# Patient Record
Sex: Female | Born: 1955 | Race: White | Hispanic: No | Marital: Married | Smoking: Never smoker
Health system: Western US, Academic
[De-identification: ages and names within clinical notes are randomized; demographics above are authoritative.]

## PROBLEM LIST (undated history)

## (undated) DIAGNOSIS — J040 Acute laryngitis: Secondary | ICD-10-CM

## (undated) HISTORY — DX: Acute laryngitis: J04.0

## (undated) HISTORY — PX: COLONOSCOPY: PRO007

## (undated) HISTORY — PX: PB EXCISION OF TONSIL TAGS: 42860

---

## 1985-07-04 DIAGNOSIS — J040 Acute laryngitis: Secondary | ICD-10-CM

## 1985-07-04 HISTORY — DX: Acute laryngitis: J04.0

## 2012-06-18 ENCOUNTER — Ambulatory Visit (INDEPENDENT_AMBULATORY_CARE_PROVIDER_SITE_OTHER): Payer: Self-pay | Admitting: Internal Medicine

## 2012-06-18 ENCOUNTER — Encounter (INDEPENDENT_AMBULATORY_CARE_PROVIDER_SITE_OTHER): Payer: Self-pay | Admitting: Internal Medicine

## 2012-06-18 VITALS — BP 135/74 | HR 88 | Temp 98.3°F | Ht 66.0 in | Wt 152.2 lb

## 2012-06-18 MED ORDER — OMEGA 3 PO: ORAL | Status: AC

## 2012-06-18 MED ORDER — MONTELUKAST SODIUM 10 MG OR TABS
10.0000 mg | ORAL_TABLET | Freq: Every evening | ORAL | Status: DC
Start: 2012-06-18 — End: 2016-02-08

## 2012-06-18 MED ORDER — BUDESONIDE (INHALATION) 180 MCG/ACT IN INHA
2.0000 | INHALATION_SPRAY | Freq: Every day | RESPIRATORY_TRACT | Status: DC
Start: 2012-06-18 — End: 2016-02-08

## 2012-06-18 MED ORDER — CAL-MAG ZINC II PO
ORAL | Status: DC
Start: ? — End: 2016-02-08

## 2012-06-18 MED ORDER — ASTRAGALUS PO
ORAL | Status: DC
Start: ? — End: 2016-02-08

## 2012-06-18 MED ORDER — VITAMIN D PO: 4000.00 [IU] | Freq: Every day | ORAL | Status: AC

## 2012-06-18 NOTE — Patient Instructions (Signed)
Please arrive 15 minutes early for your next appointment for check-in.*     Please bring all medications and supplements taken or a complete list to your next appointment.     Please bring current insurance cards and a photo ID to your next appointment.    Our phone number and fax number have changed  Phone [phone]  Fax [phone]

## 2012-06-18 NOTE — Progress Notes (Signed)
PULMONARY    CONSULTATION    Heidi Vaughn is a 56 year old woman who presents for another opinion regarding her chest symptoms.  She lives in [place], but is visiting her son here in [place].  She was born and raised in the [place].    She describes a significant episode of bronchitis in April, 2008.  She felt quite sick, and had a great deal of chest congestion, cough, and sputum production.  This took several months to clear up completely but she was left with residual symptoms of episodic cough, sputum production, and chest tightness. Infrequently, she coughs up small amounts of blood streaked sputum.  She does not have shortness of breath or exercise intolerance.  She walked from her son's apartment to our office today, a distance of about 1.5 miles.    Over the past 5 or 6 years she has seen numerous physicians including her regular internist, several pulmonologists, an ENT specialist, a gastroenterologist, and an infectious disease specialist.  Pulmonary function testing as demonstrated very mild airflow obstruction, but otherwise normal.  Chest CT done in 2009 was unremarkable.  Specifically there were no bronchial abnormalities noted.  Allergy testing showed only a significant response to dogs.  She does have two poodles at home.  She underwent bronchoscopy in 2009, and the results were unrevealing.  More recently, I an expectorated sputum specimen which demonstrated Aspergillus [place].  Because of this, she was put on a long course of voriconazole twice a day till January of next year.  She feels that the voriconazole has helped her breathing.    Heidi Vaughn has tried multiple medications at the suggestion of her pulmonologists.  She has used combination inhalers including Symbicort and Dulera with little effect on her symptoms of chest tightness and sputum production.  She has tolerated the voriconazole well and recent liver function  studies were normal.  In addition, she gets very little relief from albuterol.    She has had significant secondhand cigarette smoke as a child.  Her father smoked at least one package of cigarettes a day.  She does not describe any history of pneumonia in the past.  She did have very serious sinus problems at age 39.  She now uses a sinus wash on a regular basis with a Netti pot device.  A recent CT of her sinuses showed only minimal changes.    There was also concern for chronic GERD.  She did undergo upper endoscopy without significant findings.  Trials of acid suppression have not been helpful.  She did take a trial of prednisone in September and October of this year, perhaps with some benefit.  She has not had Pneumovax vaccination or influenza vaccination.  She was raised in [place], but there is no evidence of tuberculosis exposure.    Current Outpatient Prescriptions   Medication Sig Dispense Refill   . ASTRAGALUS PO        . budesonide (PULMICORT) 180 MCG/ACT inhaler Inhale 2 puffs by mouth daily. Rinse mouth well after use.  1 Inhaler  1   . Cholecalciferol (VITAMIN D PO) 4,000 Units by Mouth/Throat route daily.       . montelukast (SINGULAIR) 10 MG tablet Take 1 tablet by mouth every evening.  30 tablet  3   . Multiple Minerals-Vitamins (CAL-MAG ZINC II PO)        . Omega-3 Fatty Acids (OMEGA 3 PO)          No current facility-administered medications for  this visit.     PHYSICAL  EXAMINATION    BP 135/74  Pulse 88  Temp(Src) 98.3 F (36.8 C) (Oral)  Ht 5\' 6"  (1.676 m)  Wt 69.037 kg (152 lb 3.2 oz)  BMI 24.58 kg/m2  SpO2 97%    Gen.: She appears well today.  She is not dyspneic.  Not coughing.  Skin: No rash  HEENT: TMs normal.  Nasal exam is unremarkable.  Oropharynx is normal.  No evidence of chronic postnasal drip  Neck: No mass or adenopathy.  Thyroid with mild nodularity.  No JVD.  Chest: Clear.  No wheezing, no rales.  Heart: Regular rhythm at 80.  No significant murmur.  No  gallop.  Extremities: No edema.      ASSESSMENT.  #1.  Chronic asthmatic bronchitis, with more emphasis on the bronchitis aspect of her problem.  #2.  The finding of Aspergillus in an expectorated sputum sample is of no consequence.  There is no evidence of infection or invasive disease.  She does not have any of the Aspergillus associated pulmonary symptoms.  #3.  Chronic sinus disease as well as chronic acid reflux have been evaluated.  There is no evidence that they contribute significantly to her problem.      PLANS.  #1.  Patient education, extensive.  #2.  Trial of inhaled corticosteroid: Pulmicort 160, two puffs each morning.  Rinse mouth well after use.  #3.  Singulair 10 mg once daily.  #4.  Good fluid intake.  Continue regular sinus washes.  Avoid cigarette smokers and aerosolized chemicals.  #5. I strongly recommended Pneumovax vaccination and influenza vaccination.  She will consider this.  #6.  She will be returning to [place] later this month.      Gypsy Lore MD FACP [facility]  Professor of Clinical Medicine

## 2016-01-28 ENCOUNTER — Telehealth (INDEPENDENT_AMBULATORY_CARE_PROVIDER_SITE_OTHER): Payer: Self-pay | Admitting: Allergy & Immunology

## 2016-01-28 NOTE — Telephone Encounter (Signed)
Patient is scheduled for a consult with: Dr. Riedl    Internal or External Referral: External    Was Auth Entered: Yes

## 2016-02-01 NOTE — Telephone Encounter (Signed)
New patient information sent via e-mail    Thank you.

## 2016-02-08 ENCOUNTER — Ambulatory Visit (INDEPENDENT_AMBULATORY_CARE_PROVIDER_SITE_OTHER): Admitting: Neurology

## 2016-02-08 VITALS — BP 114/62 | HR 67 | Ht 66.0 in | Wt 146.0 lb

## 2016-02-08 DIAGNOSIS — H21569 Pupillary abnormality, unspecified eye: Secondary | ICD-10-CM

## 2016-02-08 DIAGNOSIS — R51 Headache: Principal | ICD-10-CM

## 2016-02-08 MED ORDER — ZOLPIDEM TARTRATE 5 MG OR TABS: 5.00 mg | ORAL_TABLET | Freq: Every evening | ORAL | Status: AC | PRN

## 2016-02-08 MED ORDER — MAGNESIUM CITRATE 100 MG PO TABS: ORAL_TABLET | ORAL | Status: AC

## 2016-02-08 MED ORDER — FEXOFENADINE HCL 180 MG OR TABS: 180.00 mg | ORAL_TABLET | Freq: Every day | ORAL | Status: AC

## 2016-02-08 MED ORDER — ZINC 50 MG OR CAPS: 50.00 mg | ORAL_CAPSULE | ORAL | Status: AC

## 2016-02-08 MED ORDER — SELENIUM 200 MCG PO TABS: 200.00 mg | ORAL_TABLET | ORAL | Status: AC

## 2016-02-08 MED ORDER — LEVOTHYROXINE SODIUM 25 MCG OR TABS: 25.00 ug | ORAL_TABLET | Freq: Every day | ORAL | Status: AC

## 2016-02-08 NOTE — Interdisciplinary (Signed)
I, Arneta Cliche, have reviewed medications and allergies with patient.

## 2016-02-08 NOTE — Progress Notes (Addendum)
Subjective:   Heidi Vaughn is a 60 year old female who is here for Headache    Lives in [facility] [place],  Had some new symptoms and additiional MRI.  HAs are more constant.   Shooting pains over the L side of he head.  Prior w/u in 6/15.  Admits to a lot of stress.   One pupil of eye was "much larger" saw an ophthalmologist, but a repeat MRI showed nothing; now pupils are reasonable equal.  Still with clogged" ears and hearing issues. Pain in throat as well.  An MRI of the cervical spine from 1/15 showed multilevel, diffuse cervical spondylosis with bilateral NF narrowing and question of compression of the bilateral C6 and R C7 nerve roots.  MRI of the orbits and face showed no significant abnormalities to explain her history of L eye pain.  CT of soft tissue of the neck was negative for lymphadenopathy.  MRI brain from 12/13/13 was normal.  Seeingh Dr. Ellsworth Lennox again this week for a rheumatologic reevaluation.  She was trying to take antidepressants in the past, without perceived benefit.  No current sinus or dental infections or trauma recently, though some childhood trauma  She brings in some ENT notes from [place] for review today. One study showed some thickening of the left pyriform sinus.  Question of trigeminal neuralgia or glossopharyngeal neuralgia. Angioedema  ?may have tried Lyrica.   All symptoms are left sided.   One imaging study showed a "mass"      HPI    Issues discussed today:  Interim history.    Review of Systems   No other new symptoms to discuss.    Current Outpatient Prescriptions   Medication Sig Dispense Refill   . Cholecalciferol (VITAMIN D PO) 4,000 Units by Mouth/Throat route daily.     . fexofenadine (ALLEGRA) 180 MG tablet Take 180 mg by mouth daily.     Marland Kitchen levothyroxine (SYNTHROID) 25 MCG tablet Take 25 mcg by mouth every morning (before breakfast).     . Magnesium Citrate 100 MG TABS      . Omega-3 Fatty Acids (OMEGA 3 PO)      . Selenium 200 MCG TABS 200 mg.     . Zinc 50 MG CAPS  50 mg.     . zolpidem (AMBIEN) 5 MG tablet Take 5 mg by mouth nightly as needed for Insomnia.       No current facility-administered medications for this visit.      No Known Allergies    Reviewed patients pertinent information related to social history, past medical, past surgical, and family history.     Objective:  Vital signs: BP 114/62 (BP cuff site: Left;Upper;Arm, BP Patient Position: Sitting, BP cuff size: Regular)  Pulse 67  Ht 5\' 6"  (1.676 m)  Wt 66.2 kg (146 lb)  BMI 23.57 kg/m2  Heart S1S2 normal. No bruits.    Neurologic Exam  Pupils now mid position and equal.  General: No distress  Ext: No LE edema  Skin: No rashes or lesions  Neurological Examination  Mental Status: Awake, alert, able to provide their own history  Speech: fluent without dysarthria  CNs: PERRL (I don't really notice any anisocoria at this time), EOMI, no facial weakness, hearing intact to conversation  Strength: Full in proximal and distal upper and lower extremities  Coordination: No ataxia on finer to nose  Gait: Normal arm swing and stride length    Assessment/Plan:  [name] was seen today for  Headache  HAs, not really typical of migraine or cluster.  Complex of pain related and otologic symptoms, mainly L sided - etiology uncertain, with essentially negative ENT w/u.  ?pupillary anisocoria by hx - says she has been referred to neuro-ophthalmology.  Talked about our [facility] here as well.    Plan: Referral to Dr. Rollene Fare for [facility] evaluation of the atypical HAs and facial pain.    Diagnosed with:    ICD-10-CM ICD-9-CM    1. Headache, unspecified headache type R51 784.0 CONSULT/REFERRAL TO NEUROLOGY   2. Pupillary abnormalities, unspecified laterality H21.569 364.75      Orders Placed This Encounter   Procedures   . Consult to Neurology Clinic     Total time spent with patient was 30 minutes of which more than  50 % was spent educating the patient about their condition and discussing diagnosis and management issues        Electronically signed by: Javier Docker, MD   Signature Derived From Controlled Access Password, February 12, 2016, 9:21 PM

## 2016-02-08 NOTE — Patient Instructions (Signed)
Referral to [facility].

## 2016-02-10 ENCOUNTER — Ambulatory Visit (INDEPENDENT_AMBULATORY_CARE_PROVIDER_SITE_OTHER): Admitting: Ophthalmology

## 2016-02-10 DIAGNOSIS — R51 Headache: Secondary | ICD-10-CM

## 2016-02-16 ENCOUNTER — Other Ambulatory Visit: Attending: Allergy & Immunology | Admitting: Allergy & Immunology

## 2016-02-16 ENCOUNTER — Encounter (INDEPENDENT_AMBULATORY_CARE_PROVIDER_SITE_OTHER): Payer: Self-pay | Admitting: Allergy & Immunology

## 2016-02-16 VITALS — BP 131/79 | HR 68 | Temp 97.7°F | Resp 16 | Ht 66.0 in | Wt 146.0 lb

## 2016-02-16 DIAGNOSIS — T783XXA Angioneurotic edema, initial encounter: Principal | ICD-10-CM | POA: Insufficient documentation

## 2016-02-16 DIAGNOSIS — D8940 Mast cell activation, unspecified: Secondary | ICD-10-CM

## 2016-02-16 MED ORDER — FAMOTIDINE 40 MG OR TABS
40.00 mg | ORAL_TABLET | ORAL | Status: AC
Start: 2016-02-10 — End: 2017-02-09

## 2016-02-16 MED ORDER — CROMOLYN SODIUM 100 MG/5ML OR CONC
ORAL | Status: AC
Start: 2016-02-10 — End: ?

## 2016-02-16 MED ORDER — LOTEMAX OP: OPHTHALMIC | Status: AC

## 2016-02-16 NOTE — Progress Notes (Signed)
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 Albin FellingProshkina.     Sx started ~07/2013 consisting of diffuse erythematous patches w/ pruritis; sx resolved with steroids and has not returned. Patient has had chronic intermittent hoarse voice and throat tightness w/o respiratory compromise; these sx typically last from several hours to half a day, improved with anti-histamines and steroids. Typically patient experiences up to several attacks per day. Denies swelling of her limbs/digits or lip swelling; patient intermittently has swelling of her lips. Denies abdominal sx including n/v, bloating, abd pain, and diarrhea. No hx of ACE use. Additionally patient c/o pruritis of eyes and ears as well as head and sinus fullness.         PMH  Past Medical History:   Diagnosis Date   . Laryngitis 1987       No Known Allergies    ROS  Complete 12 point ROS negative as below:  HEENT: as noted above in HPI  GEN-denies fevers/chills/rigors  CV-denies Chest pain, SOB  PULM-denies cough, sputum production wheeze  ABD-denies n/v, abd pain, diarrhea  SKIN-denies skin rashes currently        PE   02/16/16  0906   BP: 131/79   Pulse: 68   Resp: 16   Temp: 97.7 F (36.5 C)   SpO2: 99%     GEN: in NAD   HEENT:   -sclera: no injection   -Turbinates: erythema b/l w/ moderate mucous burden   -TM: clear b/l   -oropharynx: no erythema  PULM: CTAB, no wheeze or crackles  CV: RRR, no m/r/g  SKIN: no rashes or lesions      LABS: reviewed  TPO 78.6  Hep B Surf Ag neg  Hep C Neg  C1 111; C1Q6  IgM 410      IMAGING: reviewed  MRI Brain maxillo-facial 12/2015 notable for R inferior maxillary mucous retention cyst.        A/P: 60y/o hx of asthmatic bronchitis presenting w/ sx concerning for angioedema.    #Angioedema, ?Mastocytosis-dx of angioedema consistent with patient's subjective sx of throat tightness/hoarseness and sinus fullness,  minimal GI and cutaneous manifestations. The fact that patient's symptoms responded well to steroids and anti-histamines indicates likely histaminergic etiology though the dosage of patient's current anti-histamine regimen has not been enough to control sx completely. 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 Pt to make up follow up appointment w/ her heme/onc team.      This patient seen and evaluated with Dr. Starr Sinclaireidl    RTC 1 month or later    Heidi HectorJohn Kachina Vaughn, PGY3  1622      MEDS  Levothyroxine 25  Hydroxyzine PRN  Allegra 180 qd  Famotidine 40mg   Cromolyn 100-25300mcg

## 2016-02-16 NOTE — Patient Instructions (Signed)
1) Have recommended labs completed to identify or exclude underlying causes of throat/swelling symptoms  2) Start daily regimen of zyrtec (cetirizine) 10mg  - take 1 tab in am and 1 tab in pm.  This can be increased to 2 tabs in am and 2 tabs in pm if symptoms persist.  3) Follow-up with me in ~1 month to review lab results and review effect of zyrtec treatment.   We will then determine the next steps.  4) Use MyChart or call if questions arise in the interim.

## 2016-02-16 NOTE — Interdisciplinary (Signed)
Patient identity authenticated by Draylen Lobue, RN.  Right AC selected for lab draw. Area prepped with alcohol. 21 gauge needle used to collect labs. Gauze placed and arm wrapped with coban. Pt tolerated lab draw well.

## 2016-02-17 LAB — ALLERGENS, INHALANT PROFILE
Allergen, Bermuda Grass IgE: <0.1 kU/L (ref <0.10)
Allergen, Cat IgE: <0.1 kU/L (ref <0.10)
Allergen, Common /Short Weed IgE: <0.1 kU/L (ref <0.10)
Allergen, Cottonwood Tree IgE: <0.1 kU/L (ref <0.10)
Allergen, Dog IgE: <0.1 kU/L (ref <0.10)
Allergen, Elm Tree IgE: <0.1 kU/L (ref <0.10)
Allergen, English Plantain Weed IgE: <0.1 kU/L (ref <0.10)
Allergen, Fungi/Mold Alternaria alternata IgE: <0.1 kU/L (ref <0.10)
Allergen, Fungi/Mold Aspergillus fumigatus IgE: <0.1 kU/L (ref <0.10)
Allergen, Fungi/Mold Cladosporum herbarum IgE: <0.1 kU/L (ref <0.10)
Allergen, Fungi/Mold Penicillium chrysogenum IgE: <0.1 kU/L (ref <0.10)
Allergen, Grey Alder Tree IgE: <0.1 kU/L (ref <0.10)
Allergen, Insect Cockroach German IgE: <0.1 kU/L (ref <0.10)
Allergen, Johnson Grass IgE: <0.1 kU/L (ref <0.10)
Allergen, June/Kentucky Grass IgE: <0.1 kU/L (ref <0.10)
Allergen, Lamb Quarters Weed IgE: <0.1 kU/L (ref <0.10)
Allergen, Mesquite Tree IgE: <0.1 kU/L (ref <0.10)
Allergen, Mites, Dermatophagoides farinae IgE: <0.1 kU/L (ref <0.10)
Allergen, Mites, Dermatophagoides pteronyssinus IgE: <0.1 kU/L (ref <0.10)
Allergen, Mugwort Weed IgE: <0.1 kU/L (ref <0.10)
Allergen, Oak Tree IgE: <0.1 kU/L (ref <0.10)
Allergen, Olive Tree IgE: <0.1 kU/L (ref <0.10)
Allergen, Perennial Rye Grass IgE: <0.1 kU/L (ref <0.10)
Allergen, Russian Thistle Weed IgE: <0.1 kU/L (ref <0.10)
Allergen, Timothy Grass IgE: <0.1 kU/L (ref <0.10)
Allergen, Walnut Tree IgE: <0.1 kU/L (ref <0.10)
IGE: 9 kU/L (ref <=214)

## 2016-02-18 ENCOUNTER — Encounter (INDEPENDENT_AMBULATORY_CARE_PROVIDER_SITE_OTHER): Payer: Self-pay | Admitting: Allergy & Immunology

## 2016-02-18 NOTE — Progress Notes (Signed)
I performed a history and physical exam of the patient and discussed the patient's management with the resident.  I reviewed the resident's note and agree with the documented findings and the plan of care we developed.  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  I reviewed her symptoms in detail and discussed with her the recommended diagnostic tests.  She has a history of thyroiditis and MGUS and I strongly recommended she continue to f/u with endocrine and heme/onc respectively to ensure these are adequately managed as these condition can sometimes influence the course of urticaria and angioedema.  We discussed a management plan to institute to determine the effect of histamine-targeted therapy while we await the lab results.  I answered her questions and she was comfortable with this plan.     1) Have recommended labs completed to identify or exclude underlying causes of throat/swelling symptoms  2) Start daily regimen of zyrtec (cetirizine) 10mg  - take 1 tab in am and 1 tab in pm.  This can be increased to 2 tabs in am and 2 tabs in pm if symptoms persist.  3) Patient to f/u with her heme/onc specialist regarding MGUS managment  3) patient to follow-up with me in ~1 month to review lab results and review effect of zyrtec treatment.   We will then determine the next steps.  4) patient to use MyChart or call if questions arise in the interim.    Total time in face-to-face consultation this visit was 90 minutes, more that 50% of which was spent in counseling.

## 2016-02-19 LAB — URTICARIA-INDUCED BASOPHIL ACTIVATION, BLOOD: Urticaria-INDUCED BASOPHIL ACTIVATION-ARUP: 8 % (ref <=27)

## 2016-02-19 LAB — B-CELL CLONALITY (IGH AND IGK), BLOOD
IgH Result: NOT DETECTED
IgK Result: NOT DETECTED
Result: NOT DETECTED

## 2016-02-20 LAB — TRYPTASE, BLOOD: Tryptase: 10.5 ug/L (ref <=10.9)

## 2016-02-24 LAB — IMMUNOFIX INTERPRETATION, SERUM

## 2016-02-24 LAB — IMMUNOFIXATION, BLOOD

## 2016-02-25 LAB — HELICOBACTER PYLORI ANTIBODY-ELISA, BLOOD: Helicobacter Pylori Ab: NEGATIVE

## 2016-03-03 ENCOUNTER — Ambulatory Visit (INDEPENDENT_AMBULATORY_CARE_PROVIDER_SITE_OTHER): Admitting: Otolaryngology

## 2016-03-03 ENCOUNTER — Ambulatory Visit (INDEPENDENT_AMBULATORY_CARE_PROVIDER_SITE_OTHER): Admitting: Audiology

## 2016-03-04 ENCOUNTER — Encounter (INDEPENDENT_AMBULATORY_CARE_PROVIDER_SITE_OTHER): Payer: Self-pay

## 2016-03-04 ENCOUNTER — Ambulatory Visit (INDEPENDENT_AMBULATORY_CARE_PROVIDER_SITE_OTHER): Admitting: Neurology

## 2016-03-07 ENCOUNTER — Encounter (INDEPENDENT_AMBULATORY_CARE_PROVIDER_SITE_OTHER): Payer: Self-pay | Admitting: Allergy & Immunology

## 2016-03-08 LAB — CHROMOGENIC C1-INH FUNCTION

## 2016-03-08 NOTE — Telephone Encounter (Signed)
From: Dianah FieldIrene Carnero  To: Elie Goodyiedl, Marc A, MD  Sent: 03/07/2016 10:14 AM PDT  Subject: 20-Other    Dr. Tawnya Crookiedl, I have visited you on 02-16-16. [place] [address] [place] I'm going to be back home on Wednesday 03-09-16.   Can I make a tel or Skype follow up appointment with you to discuss my symptoms and the results of my tests?

## 2016-03-14 ENCOUNTER — Telehealth (INDEPENDENT_AMBULATORY_CARE_PROVIDER_SITE_OTHER): Payer: Self-pay

## 2016-03-14 NOTE — Telephone Encounter (Signed)
Spoke with Dr. Tawnya Crookiedl and he could not find a [facility]. I spoke with Carley HammedEva at [facility] and advised that [facility] ships this lab to be processed at [facility]. Requested if [facility] could do the same. Awaiting a call back from Carley Hammedva once she speaks to her supervisor.

## 2016-03-14 NOTE — Telephone Encounter (Signed)
[facility] in [place], [place] calling because unable to process Leukotriene E4 24 hr Urine Test. Requiring another name for test and test number specific to [facility]. Awaiting Dr. Fuller Canadaiedl's advisement when he is in clinic at noon.

## 2016-03-15 NOTE — Telephone Encounter (Signed)
Spoke with Heidi Vaughn. Unable to process request. LTE 4 will not be processed at this time.

## 2016-03-20 ENCOUNTER — Encounter (INDEPENDENT_AMBULATORY_CARE_PROVIDER_SITE_OTHER): Payer: Self-pay | Admitting: Allergy & Immunology

## 2016-03-21 ENCOUNTER — Encounter (INDEPENDENT_AMBULATORY_CARE_PROVIDER_SITE_OTHER): Payer: Self-pay

## 2016-03-22 NOTE — Telephone Encounter (Signed)
Forwarding to MD for review

## 2016-03-25 ENCOUNTER — Encounter (INDEPENDENT_AMBULATORY_CARE_PROVIDER_SITE_OTHER): Payer: Self-pay

## 2016-04-02 ENCOUNTER — Telehealth (INDEPENDENT_AMBULATORY_CARE_PROVIDER_SITE_OTHER): Payer: Self-pay | Admitting: Allergy & Immunology

## 2016-04-02 NOTE — Telephone Encounter (Signed)
I spoke to patient by phone for f/u.  She reports her cutaneous symptoms are much improved with taking cetirizine.  She initially took this 20mg  BID as advised and had complete resolution of symptoms.  She gradually tapered her dose over several weeks and eventually began to have recurrence of her skin redness, itching, and swelling which was acutely improved with taking more cetirizine.  She currently is taking cetirizine as needed and is overall satisfied with the symptom relief it provides.  She remains concerned that a local company tested her home and found some airborne mold which she thinks may be causing her symptoms.  I reviewed with her the labs results we performed which do not show any evidence of a systemic mast cell condition. Her allergy testing did not show any evidence of mold allergy.  I advised her that more comprehensive allergy testing or skin testing could still show some sensitivities we did not detect, but I cautioned her against worrying about "toxic mold" issues as this is largely a scare tactic used by remediation companies to encourage unnecessary construction work.  If she is concerned about mold allergy or irritant effects, I advised that she pursue additional allergy testing locally or consider a trial of a HEPA filter in the room(s) of concern as this will effectively remove airborne mold spores.  In the meantime, I recommended she continue with her antihistamine regimen as needed as this has been highly effective in treating her symptoms.  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  She plans to visit our clinic again in Feb 2018, so we'll plan to retest her then.  She'll contact me if anything changes with her clinical status in the meantime.

## 2016-04-11 ENCOUNTER — Encounter (INDEPENDENT_AMBULATORY_CARE_PROVIDER_SITE_OTHER): Payer: Self-pay

## 2016-08-18 ENCOUNTER — Telehealth (INDEPENDENT_AMBULATORY_CARE_PROVIDER_SITE_OTHER): Payer: Self-pay | Admitting: Allergy & Immunology

## 2016-08-18 NOTE — Telephone Encounter (Signed)
Attempted to contact patient per epic message regarding follow up appointment. No answer left voicemail for patient to call back and schedule. Please assist Thank You

## 2016-10-03 ENCOUNTER — Encounter (INDEPENDENT_AMBULATORY_CARE_PROVIDER_SITE_OTHER): Payer: Self-pay | Admitting: Allergy & Immunology

## 2017-02-17 ENCOUNTER — Telehealth (INDEPENDENT_AMBULATORY_CARE_PROVIDER_SITE_OTHER): Payer: Self-pay | Admitting: Allergy & Immunology

## 2017-02-17 NOTE — Telephone Encounter (Signed)
-----   Message from Latanya Presser, [place] sent at 02/17/2017 10:46 AM PDT -----  Regarding: FW: Appointment Request  Contact: [phone]      ----- Message -----     From: Heidi Vaughn     Sent: 02/17/2017   8:02 AM       To: [facility]  Subject: Appointment Request                              Appointment Request From: Heidi Vaughn    With Provider: Elie Goody, MD [facility]]    Preferred Date Range: From 02/17/2017 To 03/13/2017    Preferred Times: Any    Reason for visit: Ear and throat pain, possibly because of some allergy     Comments:  Please, make an appointment for me before 03/14/2017. [place]'m [place], and leaving on 03/14/2017  Thank you.

## 2017-02-17 NOTE — Telephone Encounter (Signed)
Called patient, per Dr. Tawnya Crook scheduled follow up appt for 02/21/17 at 3:45pm. Patient agreed.

## 2017-02-21 ENCOUNTER — Other Ambulatory Visit (INDEPENDENT_AMBULATORY_CARE_PROVIDER_SITE_OTHER): Attending: Allergy & Immunology

## 2017-02-21 ENCOUNTER — Ambulatory Visit (INDEPENDENT_AMBULATORY_CARE_PROVIDER_SITE_OTHER): Admitting: Allergy & Immunology

## 2017-02-21 ENCOUNTER — Encounter (INDEPENDENT_AMBULATORY_CARE_PROVIDER_SITE_OTHER): Payer: Self-pay | Admitting: Allergy & Immunology

## 2017-02-21 VITALS — BP 110/71 | HR 70

## 2017-02-21 DIAGNOSIS — T783XXD Angioneurotic edema, subsequent encounter: Principal | ICD-10-CM

## 2017-02-21 DIAGNOSIS — J3089 Other allergic rhinitis: Secondary | ICD-10-CM

## 2017-02-21 MED ORDER — CYCLOSPORINE 0.05 % OP EMUL: 1.00 [drp] | Freq: Two times a day (BID) | OPHTHALMIC | Status: AC

## 2017-02-21 MED ORDER — CETIRIZINE HCL 10 MG OR TABS: 10.00 mg | ORAL_TABLET | Freq: Every day | ORAL | Status: AC

## 2017-02-21 NOTE — Interdisciplinary (Signed)
Blood drawn from left arm with 23 gauge needle. 1 tubes taken.   Patient identity authenticated by Laurence J Orgaya.

## 2017-02-23 LAB — TRYPTASE, BLOOD: Tryptase: 11.1 ug/L — ABNORMAL HIGH (ref <=10.9)

## 2017-02-26 ENCOUNTER — Encounter (INDEPENDENT_AMBULATORY_CARE_PROVIDER_SITE_OTHER): Payer: Self-pay | Admitting: Allergy & Immunology

## 2017-02-26 NOTE — Progress Notes (Signed)
Patient is a 61 yo female presenting for consultation, evaluation, and management of allergic respiratory and skin symptoms.    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 She reports struggling lately from a clinical standpoint due to upper airway symptoms. She describes these as a recurring left-sided throat pain which occasionally radiates to her left ear. Her ears often feel clogged and she also describes her ears occasionally turning red with mild swelling and burning. She showed me photographs of her right ear which was clearly erythematous and slightly swollen. Overall she feels very anxious about the symptoms and is bothered by the discomfort in her throat, her postnasal drip and ear discomfort. Because of this she saw ENT which did not see any evidence of throat inflammation. She was treated for reflux with a proton pump inhibitor for 2-3 months but did not experience substantial improvement. 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 She has previously been on Zyrtec or fexofenadine daily and intermittently though has not taken these on a regular basis. She has not recently used intranasal steroids. She also describes ocular pruritus symptoms though these are less bothersome to her than the throat symptoms.    Current Meds  Current Outpatient Prescriptions:     cetirizine (ZYRTEC) 10 MG tablet, Take 10 mg by mouth daily., Disp: , Rfl:     Cholecalciferol (VITAMIN D PO), 4,000 Units by Mouth/Throat route daily., Disp: , Rfl:     cromolyn (GASTROCROM) 100 MG/5ML solution, 5-10 ml po before meals and at bedtime, Disp: , Rfl:     cycloSPORINE (RESTASIS) 0.05 % ophthalmic emulsion, Place 1 drop into both eyes 2 times daily., Disp: , Rfl:     famotidine (PEPCID) 40 MG tablet, Take 40 mg by mouth., Disp: , Rfl:     fexofenadine (ALLEGRA) 180 MG tablet, Take 180 mg by mouth daily., Disp:  , Rfl:     levothyroxine (SYNTHROID) 25 MCG tablet, Take 25 mcg by mouth every morning (before breakfast)., Disp: , Rfl:     Loteprednol Etabonate (LOTEMAX OP), , Disp: , Rfl:     Magnesium Citrate 100 MG TABS, , Disp: , Rfl:     Omega-3 Fatty Acids (OMEGA 3 PO), , Disp: , Rfl:     Selenium 200 MCG TABS, 200 mg., Disp: , Rfl:     Zinc 50 MG CAPS, 50 mg., Disp: , Rfl:     zolpidem (AMBIEN) 5 MG tablet, Take 5 mg by mouth nightly as needed for Insomnia., Disp: , Rfl:   Allergies:Review of patient's allergies indicates no known allergies.    14-Point Review Of Systems: Constitutional: No fevers, chills, or weight loss. Eyes: no diplopia, no photophobia. ENT: No tinnitus or vertigo. Cardiovascular: no chest pain, palpitations, or swelling of lower extremities. Respiratory: no hemoptysis or cyanosis. Gastrointestinal: No nausea, vomiting, or diarrhea. Genitourinary: No hematuria or dysuria. Musculoskeletal: No muscle weakness, muscle pain, or joint swelling. Skin: no wounds, incisions, or nodules Neurologic:no seizures, limb weakness, or speech problems   Psychiatric:no chronic depression, anxiety, or anhedonia.  Endocrine: no heat or cold intolerance, no goiter. Hematologic: no bleeding, no lympadenopathy. Allergic: no anaphylaxis, sting or food reactions except as above. All other systems negative except as described in the HPI.    Physical Examination:Blood pressure 110/71, pulse 70, SpO2 99 %.   GENERAL APPEARANCE: well-developed, no acute distress. EYES: normal conjunctivae and lids bilaterally. Bilateral PERRLA,  EOMI. ENT: oropharynx clear, no thrush or oral ulcers. Ear canals and TMs clear bilaterally. Nasal mucosa clear bilaterally, no polyps or septal deviation. NECK: supple with normal range of motion, no masses. No thyroid masses or tenderness. RESPIRATORY: Normal respiratory effort, chest clear to auscultation and percussion bilaterally, no rales or wheezes, normal I:E ratio. CARDIOVASCULAR: RRR, S1S2,  no m/g/r. 2+ radial and pedal pulses bilaterally. No lower extremity edema. ABDOMEN: soft, NT/ND, no masses, no hepatosplenomegaly. LYMPHATIC: No cervical or axillary LAD. MUSCULOSKELETAL: No extremity clubbing or cyanosis. Normal range of motion in all four extremities. Normal muscle strength and tone in all four extremities. SKIN: Warm with normal turgor. No rashes or lesions.    Impression/ Recommendations:  1) Heidi Vaughn is a 61 year old woman with a history of recurrent throat discomfort, postnasal drip, ear pain and swelling. I had a long discussion with her today about the possible allergic or inflammatory causes of this. Her previous serological IgE testing was negative, but given the bothersome nature of her symptoms I recommended we pursue allergy skin tests to identify any environmental triggers for her symptoms with better sensitivty. We also discussed the possibility of a sinus CT though we will hold off on this for the moment. I recommended we check a tryptase level to evaluate for mast cell issues given that some of her symptoms are consistent with this possibility and her previous tryptase level was at the upper limit of normal.  I recommended she resume taking Zyrtec 10 mg daily on a regular basis to see if this will improve any of her symptoms. Once we have her test results we can better direct therapy based on the findings. I answered her questions and she was comfortable proceeding in this fashion. I will see her back in a few weeks after her allergy skin testing so we can review a more detailed management approach.    Total time in face-to-face consultation this visit was 40 minutes, more that 50% of which was spent in counseling the patient on symptoms and causes of the medical condition, interpretation of the diagnostic test results, as well as management options and recommendations.

## 2017-03-02 ENCOUNTER — Encounter (INDEPENDENT_AMBULATORY_CARE_PROVIDER_SITE_OTHER): Payer: Self-pay | Admitting: Allergy & Immunology

## 2017-03-02 DIAGNOSIS — L209 Atopic dermatitis, unspecified: Principal | ICD-10-CM

## 2017-03-02 MED ORDER — DESOXIMETASONE 0.05 % EX CREA
TOPICAL_CREAM | CUTANEOUS | 2 refills | Status: AC
Start: 2017-03-02 — End: ?

## 2017-03-02 NOTE — Telephone Encounter (Signed)
From: Dianah FieldIrene Zolman  To: Elie Goodyiedl, Marc A, MD  Sent: 03/02/2017 6:34 AM PDT  Subject: 20-Other    My blood was drawn on 08/21 at emergency center. When will it be posted on the portal?

## 2017-03-02 NOTE — Telephone Encounter (Signed)
From: [name]  To: Elie Goodyiedl, Marc A, MD  Sent: 03/02/2017 6:32 AM PDT  Subject: 20-Other    Please, I need a prescription for the following medication: Topicort (Desoxlmetasone Cream usp, 0.05%)

## 2017-03-07 ENCOUNTER — Telehealth (INDEPENDENT_AMBULATORY_CARE_PROVIDER_SITE_OTHER): Payer: Self-pay | Admitting: Allergy & Immunology

## 2017-03-07 NOTE — Telephone Encounter (Signed)
Patient is requesting a call back with results:    Order Provider: Dr.Riedl  Test:Tryptase, Blood Yellow serum separator tube (Order [id number])  Date of Test:done 8/21  Best Call Back #  OK to leave a Voicemail (Y/N)?    Pt requesting for clinic to place results on Mychart    Pt requesting call back from clinic to reschedule appt for Allergy test, pt requesting earlier appt, if not, pt will be ok to come in in the afternoon.  Please assist.  thank you.

## 2017-03-07 NOTE — Telephone Encounter (Signed)
Pt notified there are zero appointments on Friday 03/10/17 in the a[facility].  Pt opting to keep afternoon appt and will check in at 1500.  No further request at this time; saving this to chart.

## 2017-03-10 ENCOUNTER — Ambulatory Visit (INDEPENDENT_AMBULATORY_CARE_PROVIDER_SITE_OTHER)

## 2017-03-10 ENCOUNTER — Encounter (INDEPENDENT_AMBULATORY_CARE_PROVIDER_SITE_OTHER): Payer: Self-pay | Admitting: Allergy & Immunology

## 2017-03-10 DIAGNOSIS — J3089 Other allergic rhinitis: Secondary | ICD-10-CM

## 2017-03-10 DIAGNOSIS — T783XXD Angioneurotic edema, subsequent encounter: Principal | ICD-10-CM

## 2017-03-10 NOTE — Progress Notes (Signed)
Pt in clinic for allergen skin testing (see Media tab for result data sheet). Confirmed with pt that they have not taken antihistamine for greater than 1-week. Pt denies any s/sx of illness at this time and states they are feeling well.    Pt positioned seated in recliner with forearms exposed and resting on pillow. Pt skin cleansed with alcohol and test sites marked and numbered. 40 allergen pricks applied with one negative saline control and one positive histamine control for a total of 42 pricks. 15-minutes elapsed and results were recorded on data sheet. Pt skin cleansed with alcohol.  Pt tolerated well, no signs or symptoms of distress throughout procedure.     Pt waiting in exam room to consult with doctor.

## 2017-04-17 ENCOUNTER — Telehealth (INDEPENDENT_AMBULATORY_CARE_PROVIDER_SITE_OTHER): Payer: Self-pay | Admitting: Allergy & Immunology

## 2017-04-17 NOTE — Telephone Encounter (Signed)
Pt called stating that she billed $40 copay for a doctor visit on 03-10-17. She wants to know why is it coded as a doctor visit when she only she the nurse for skin testing.  She talked to billing already and they advised to call clinic.  Please call back and advise.  Thank you

## 2017-04-17 NOTE — Telephone Encounter (Signed)
Called patient left message that that visit on 03/12/17 was coded as Nurse Visit. Advised patient she would need to call billing back to discuss. Provided billing contact number.

## 2017-04-24 ENCOUNTER — Encounter (INDEPENDENT_AMBULATORY_CARE_PROVIDER_SITE_OTHER): Payer: Self-pay | Admitting: Allergy & Immunology

## 2017-04-24 NOTE — Telephone Encounter (Signed)
From: Dianah FieldIrene Burgner  To: Elie Goodyiedl, Marc A, MD  Sent: 04/24/2017 11:36 AM PDT  Subject: 2-Procedural Question    Hello Dr. Tawnya Crookiedl,  I have a question regarding my allergy test. It looks that it is showing allergy only to dust, and I do not have allergies which were reviled before. When I was tested, the nurse told that I had quite low histamine level, even I hadn't been taking antihistamines (Zyrtec) for 7 days before the test. What does it mean?  I had been continuing taking Zyrtec till last Saturday (forgot to buy it and was very busy). Today my throat is not normal. My knees and muscles (legs, arms) have been hurting for a while, and the knees are bad today. I'm not sure if it is connected to not taking Zyrtec for two days. I have just taken a pill.  Do I have to be taken care by some doctor in my city Health visitor(immunologist, hematologist)?

## 2017-06-28 ENCOUNTER — Encounter (INDEPENDENT_AMBULATORY_CARE_PROVIDER_SITE_OTHER): Payer: Self-pay | Admitting: Allergy & Immunology

## 2017-06-28 NOTE — Telephone Encounter (Signed)
From: Dianah FieldIrene Minnich  To: Elie Goodyiedl, Marc A, MD  Sent: 06/28/2017 9:32 AM PST  Subject: 1-Non Urgent Medical Advice    Hello Dr.Reidal,  I have been taking Zyrtec every day as you recommended. Last week I have started feeling dizzy. I have constant headache (mostly on the left side) now, weakness, and I sweat a lot. I feel dripping from sinus and spit out phlegm with some blood in it. The temperature and blood pressure are normal.   [place] and [place] neurologists in [place] and in [place]. I had CT done before. CT didn't show a problem. Are there any sinus problems causing bleeding which cannot be revealed by CT? Can allergy cause such bleeding?

## 2017-07-17 ENCOUNTER — Encounter (INDEPENDENT_AMBULATORY_CARE_PROVIDER_SITE_OTHER): Payer: Self-pay | Admitting: Allergy & Immunology

## 2017-07-17 NOTE — Telephone Encounter (Signed)
From: [name]  To: Elie Goodyiedl, Marc A, MD  Sent: 07/17/2017 5:08 AM PST  Subject: 1-Non Urgent Medical Advice    DR. Riedl,  Thank you. I had bad headache, and have almost forgotten to check the portal for your reply. Sorry about that.  I have insomnia, and take ambien for it. Can Zyrtec be replaced by [facility]? [email or web address]://www.webmd.com/drugs/[email or web address]/drug-148754/wal-som-diphenhydramine-oral/details It is antihistamine and also makes me sleepy replacing Ambien.  ----- Message -----  From: Elie GoodyMarc A Riedl, MD  Sent: 06/28/2017 16:1011:19 AM PST  To: [name]  Subject: RE: 1-Non Urgent Medical Advice  Heidi CarlHi [name],    Allergy issues generally don't cause bleeding unless a side effect of nasal sprays used for treatment or from excessive dryness. The zyrtec could theoretically contribute to dryness but I've typically only seen this at higher doses of 2-4 tabs daily. The best tests to look for sinus issues are either the sinus CT scan or the ENT specialist examining by scope. If the problem persists, I would definitely f/u with your ENT or local PCP for evaluation. You can try stopping the zyrtec for a few days to see if anything improves, though I doubt the medication is causing all of theses issues.    Best,    M. Riedl    ----- Message -----   From: [name]   Sent: 06/28/2017 9:32 AM PST   To: Elie GoodyMarc A Riedl, MD  Subject: 1-Non Urgent Medical Advice    Hello Dr.Reidal,  I have been taking Zyrtec every day as you recommended. Last week I have started feeling dizzy. I have constant headache (mostly on the left side) now, weakness, and I sweat a lot. I feel dripping from sinus and spit out phlegm with some blood in it. The temperature and blood pressure are normal.   I [place] and have [place] neurologists in [place] and in [place]. I had CT done before. CT didn't show a problem. Are there any sinus problems causing bleeding which cannot be revealed by CT? Can allergy cause such bleeding?

## 2018-06-18 ENCOUNTER — Encounter (INDEPENDENT_AMBULATORY_CARE_PROVIDER_SITE_OTHER): Payer: Self-pay | Admitting: Allergy & Immunology

## 2018-06-18 NOTE — Telephone Encounter (Signed)
From: Dianah FieldIrene Giron  To: Elie GoodyMarc A Riedl, MD  Sent: 06/18/2018 5:786:14 AM PST  Subject: 1-Non Urgent Medical Advice    Hello Dr. Cherylann Rateleidel. During my visit to [place] in November I had my blood tests done (Tryptase). The level is a bit higher than normal. My doctor has recommended me to talk to immunologist. Unfortunately, I couldn't find a good immunologist in the city I live in [place]. Please. Please, find the result of the test enclosed. What will be your recommendations? 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 I was taking zyrtec but stopped about two-three months ago because of bad abdominal problem (inflammation).

## 2018-07-13 ENCOUNTER — Encounter (INDEPENDENT_AMBULATORY_CARE_PROVIDER_SITE_OTHER): Payer: Self-pay | Admitting: Allergy & Immunology

## 2019-07-18 ENCOUNTER — Encounter: Payer: Self-pay | Admitting: Hospital

## 2019-10-03 ENCOUNTER — Encounter (INDEPENDENT_AMBULATORY_CARE_PROVIDER_SITE_OTHER): Payer: Self-pay

## 2023-09-08 IMAGING — CT CT ABD/PEL W CONT
2 of 5 series · 11 of 46 positions shown, 12 images · non-contrast
Comparison: None

HISTORY: Abnormal weight loss
TECHNIQUE: CT scans of the abdomen and pelvis are performed at 5 mm increments following administration of oral contrast and IV administration of 100 cc of [id number]-311 with coronal and sagittal reconstructions. i-STAT creatinine is 1.0 mg/dL.

Dose reduction technique used: Automated exposure control and adjustment of the mA and/or kV according to patient size. CT count in previous 12-months: 1.
Total radiation dose to patient is CTDIvol 18.39 mGy and DLP 468.70 mGy-cm.

[Series 4: soft tissue · axial · 0.42mm/px · z∈[+1163,+1517]mm · 8 of 89 slices shown, 9 images]
[im 9/89  soft-tissue]
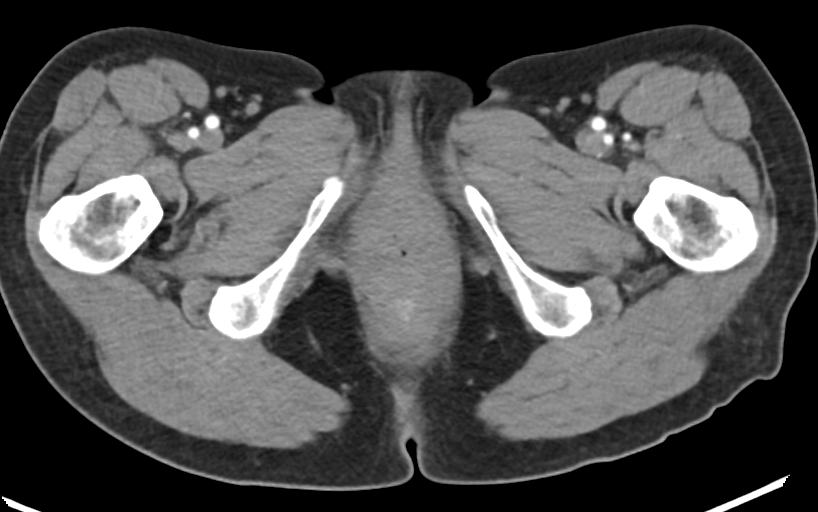
[im 9/89  bone]
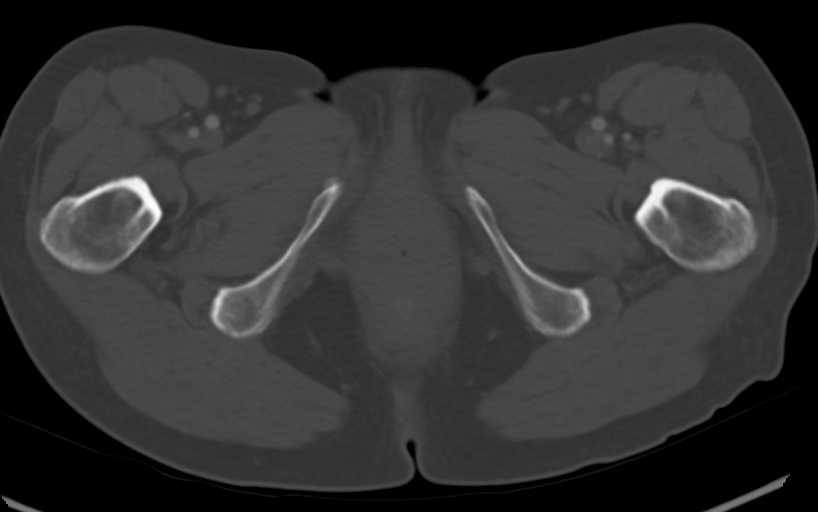
[im 18/89  soft-tissue]
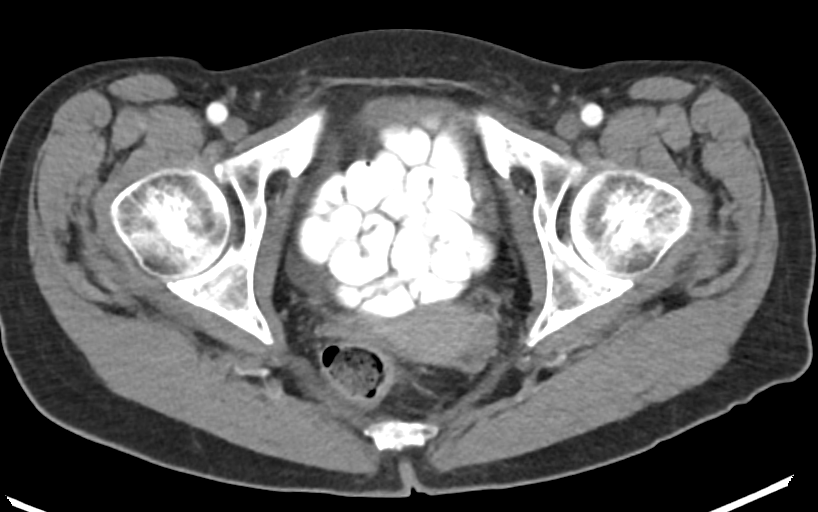
[im 27/89  soft-tissue]
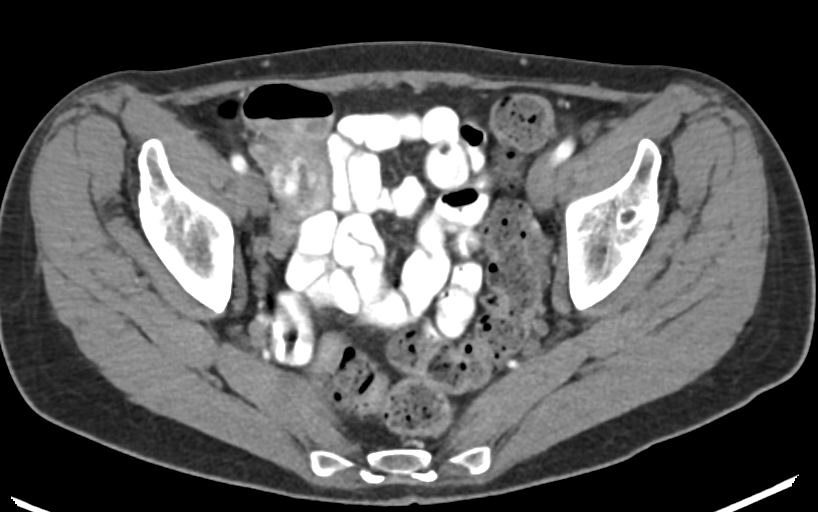
[im 40/89  soft-tissue]
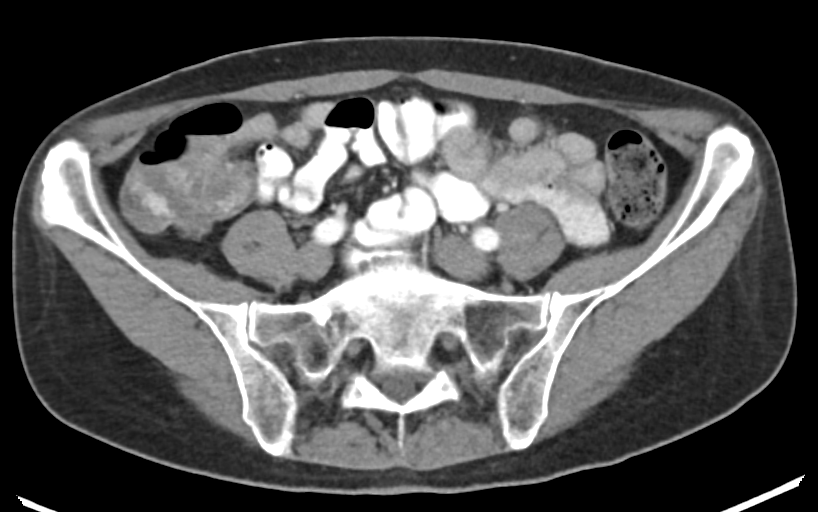
[im 49/89  soft-tissue]
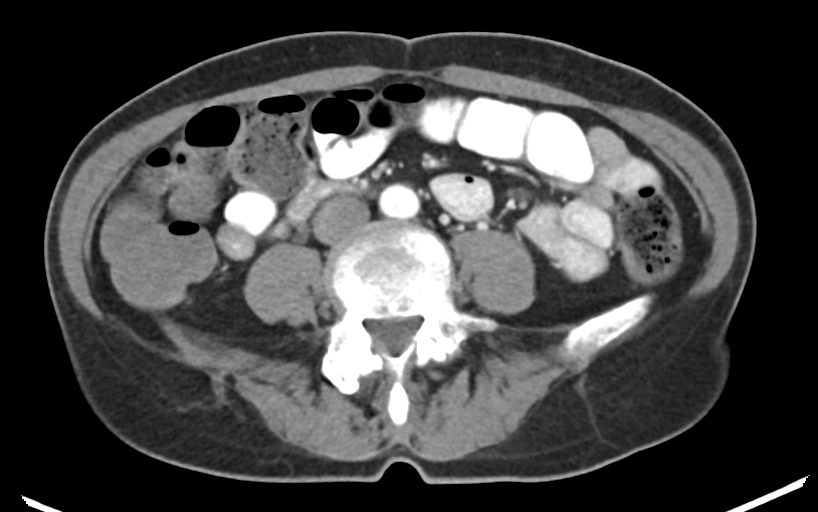
[im 62/89  soft-tissue]
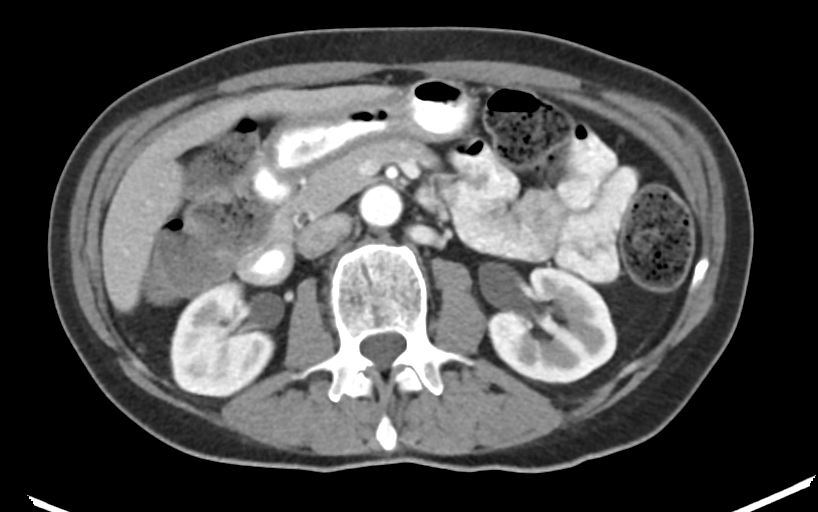
[im 71/89  soft-tissue]
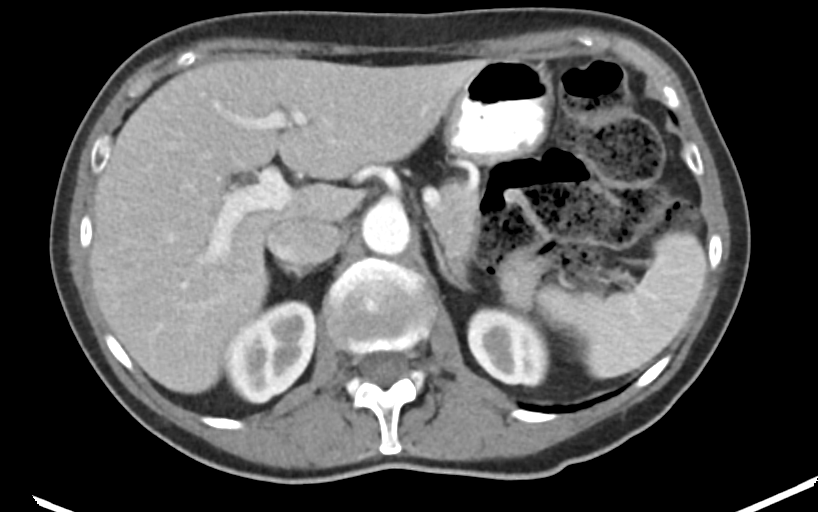
[im 80/89  soft-tissue]
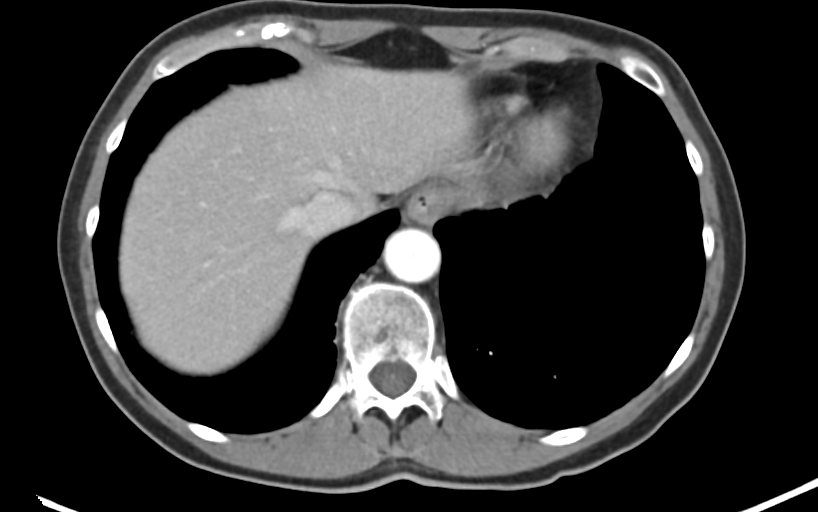

[Series 6: coronal · coronal · 0.67mm/px · 3 of 43 slices shown]
[im 15/43  soft-tissue]
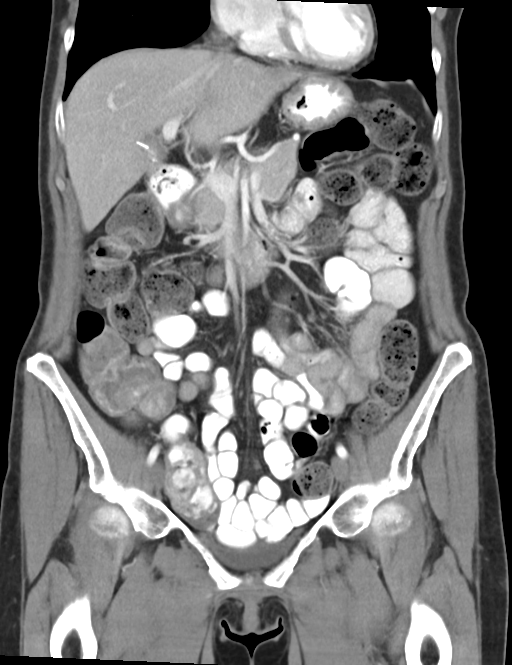
[im 19/43  soft-tissue]
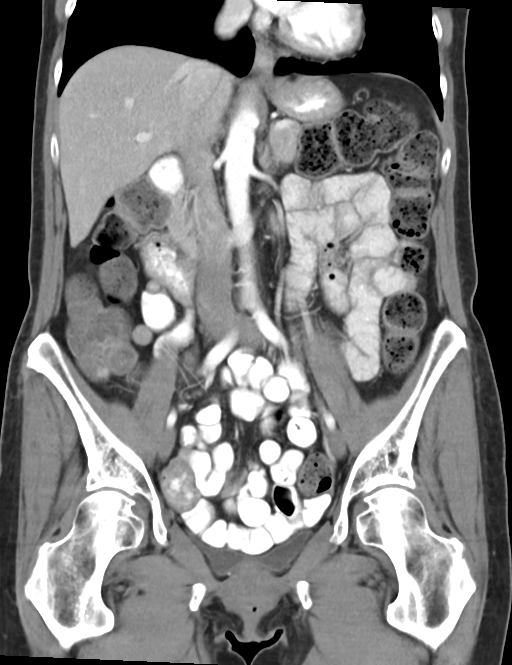
[im 24/43  soft-tissue]
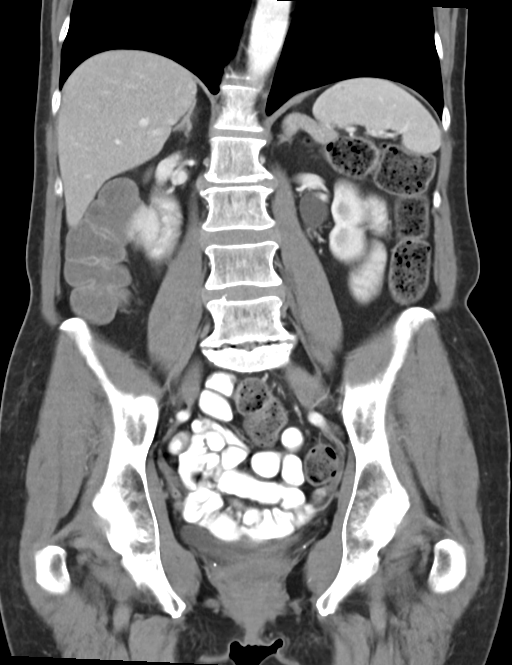

[11 of 46 positions shown; findings below may reference images not displayed]

FINDINGS: CT ABDOMEN: Lung bases are clear. Caudal margin of heart is normal. No hiatal hernia is present. The liver, spleen, biliary system status post cholecystectomy, pancreas, adrenal glands, and kidneys are normal. The periaortic and paracaval spaces are normal and no intraperitoneal findings in upper or mid-abdomen are seen. Previous exam stated mild mesenteric panniculitis may have been present, but this is not seen on today's study. Since previous exam, considerable weight loss has occurred.

CT PELVIS: The right colon, including terminal ileum and appendix, is normal. Moderate constipation is present with mild sigmoid diverticulosis without diverticulitis. Atrophic uterus and adnexal structures are present. No ovarian mass is present. Distal ureters, bladder, and inguinal regions are normal. No bone findings are present.
IMPRESSION: Moderate constipation with negative CT scan of abdomen and pelvis for acute or significant findings.

## 2024-03-10 IMAGING — US US RENAL RETRO COMPLETE
1 series · 14 of 25 positions shown · non-contrast
Comparison: None

HISTORY: 65-year-old female. Flank pain.

[Series 1: us renal retro complete · 14 of 27 slices shown]
[im 1/27]
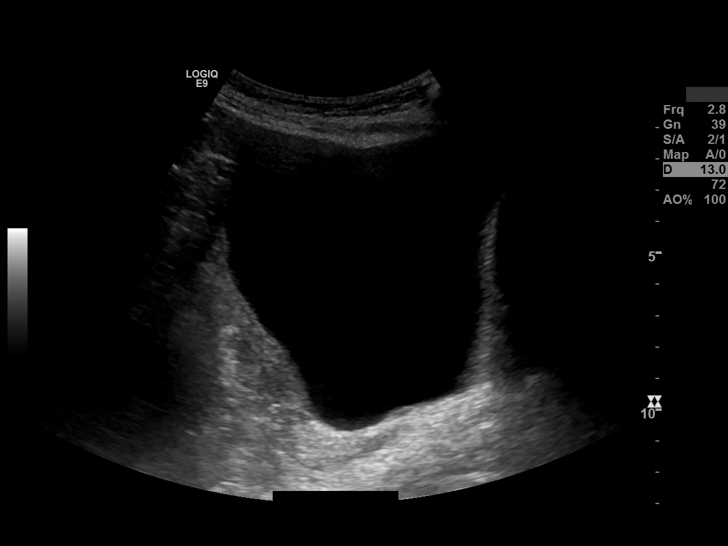
[im 3/27]
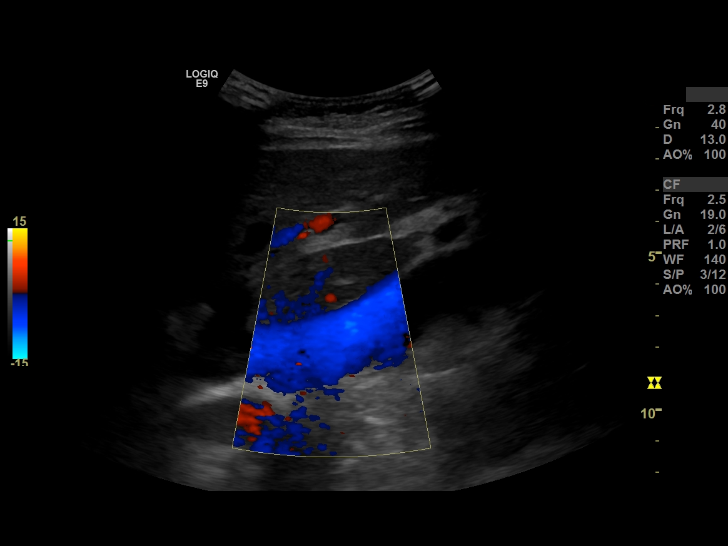
[im 5/27]
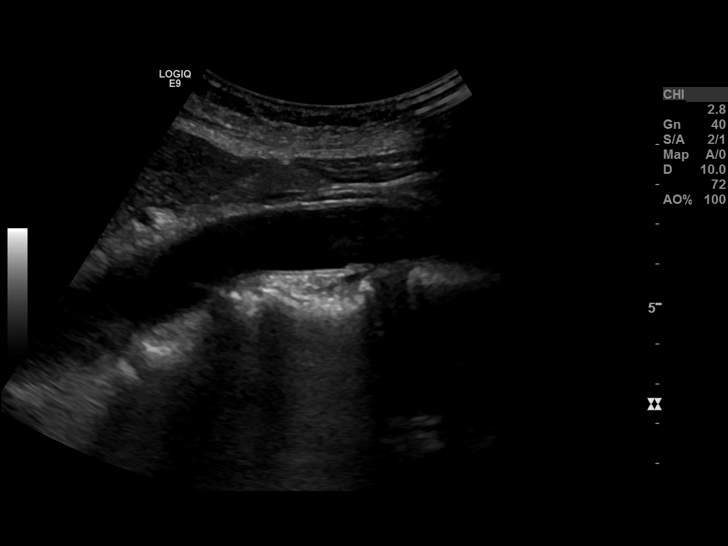
[im 7/27]
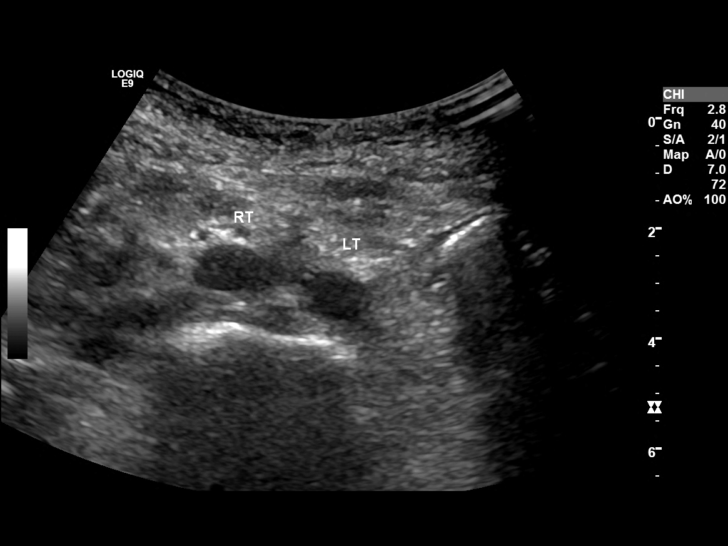
[im 9/27]
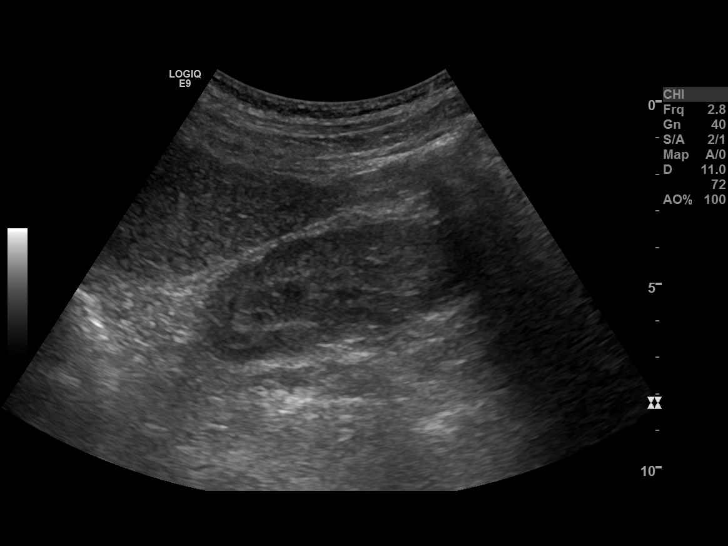
[im 10/27]
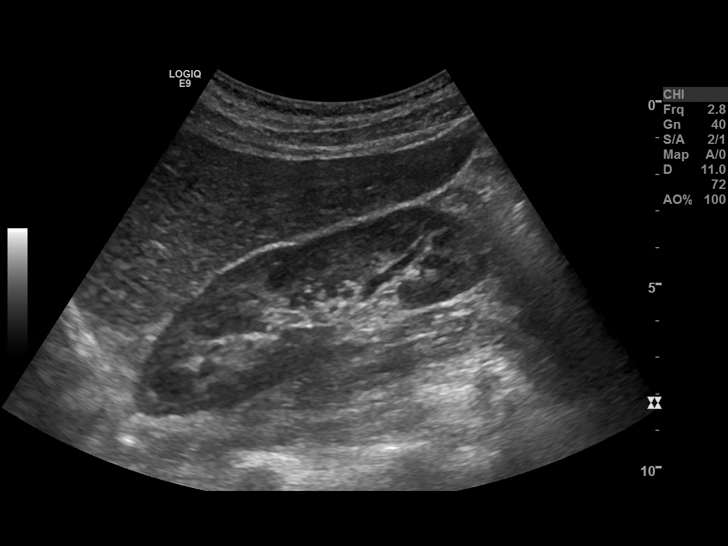
[im 12/27]
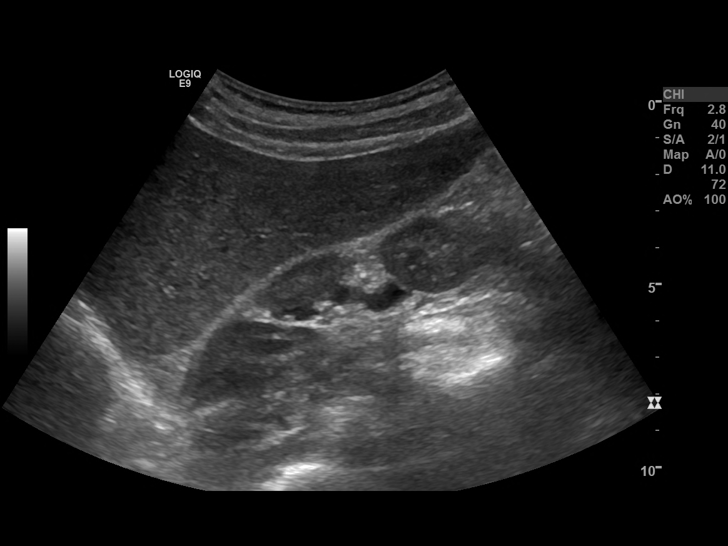
[im 15/27]
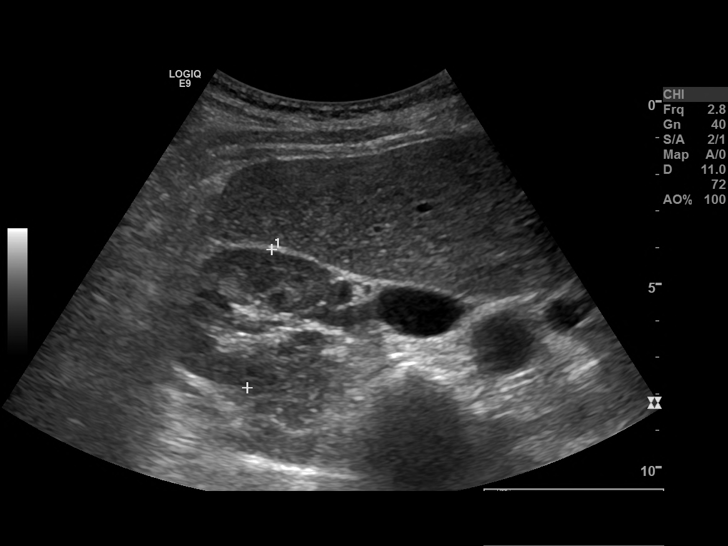
[im 17/27]
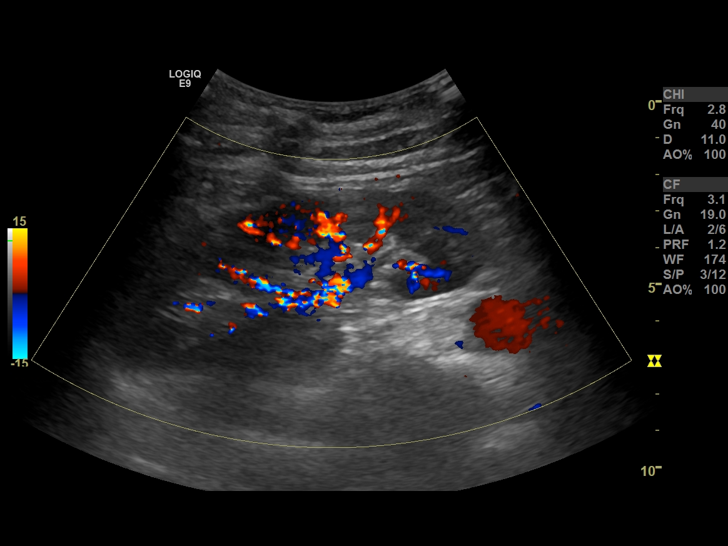
[im 18/27]
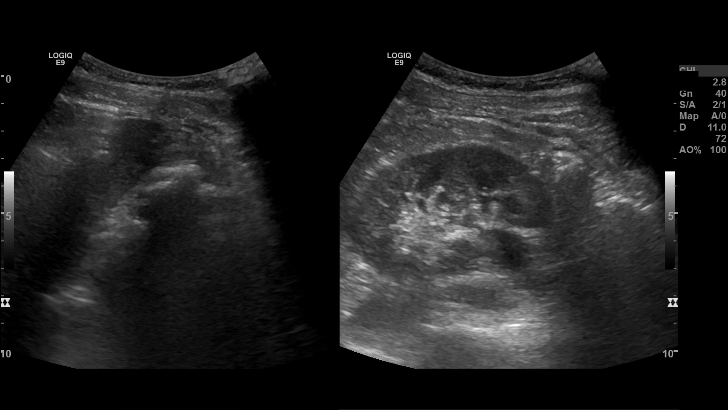
[im 20/27]
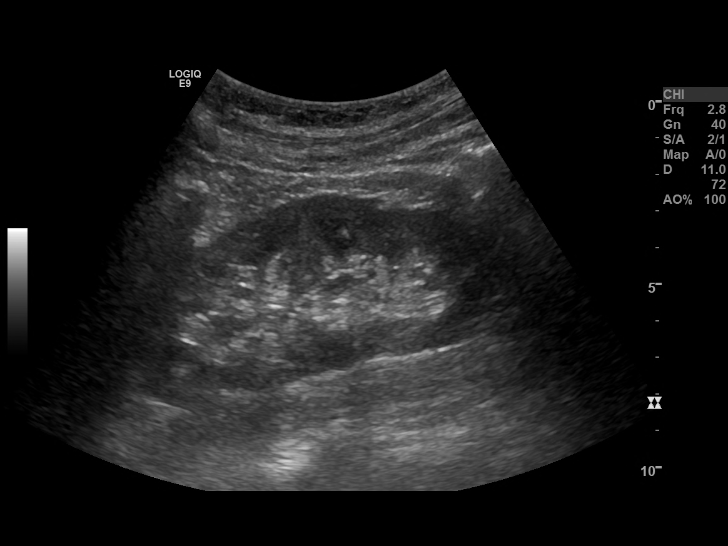
[im 22/27]
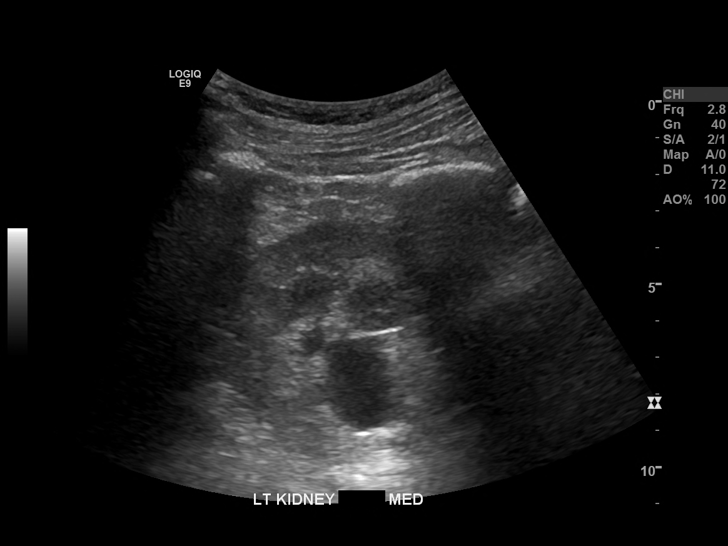
[im 24/27]
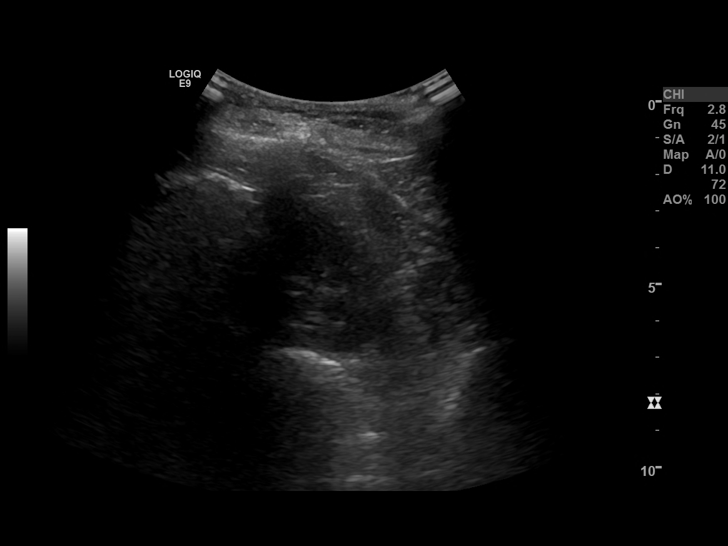
[im 27/27]
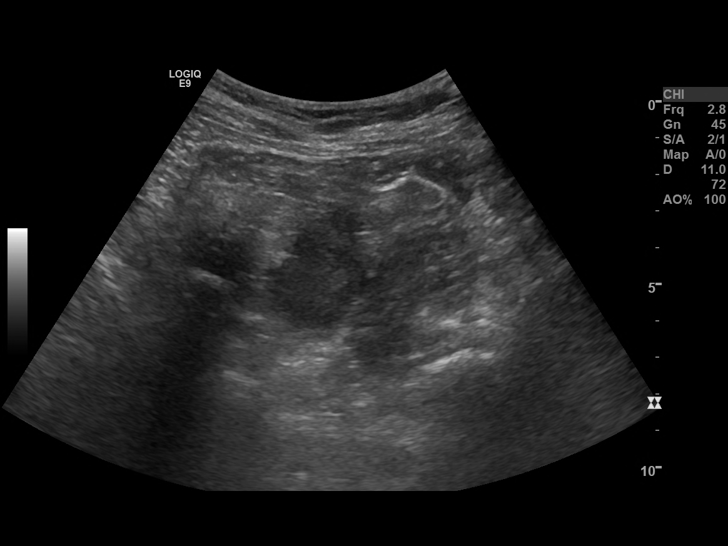

[14 of 25 positions shown; findings below may reference images not displayed]

TECHNIQUE AND FINDINGS:

Multiple ultrasound images of the retroperitoneum were obtained. 

The right kidney measures 10.6 x 3.6 x 3.8 cm and has a normal echotexture without hydronephrosis or focal lesions. The cortical thickness measures 12 mm.

The left kidney measures 10.6 x 4.9 x 4.4 cm and has a normal echotexture without hydronephrosis or focal lesions. The cortical thickness measures 11 mm.

The bladder is well distended and grossly normal in appearance.

Abdominal aorta: The visualized portion of the abdominal aorta is unremarkable.

Inferior vena cava: The visualized portion of the inferior vena cava is unremarkable.

Common iliac arteries/Bifurcation: The visualized portion is unremarkable
IMPRESSION: Negative exam.
